# Patient Record
Sex: Male | Born: 1985 | Race: White | Hispanic: No | Marital: Married
Health system: Southern US, Community
[De-identification: ages and names within clinical notes are randomized; demographics above are authoritative.]

---

## 2019-07-29 ENCOUNTER — Emergency Department (HOSPITAL_COMMUNITY): Payer: Managed Care, Other (non HMO)

## 2019-07-29 ENCOUNTER — Encounter (HOSPITAL_COMMUNITY): Payer: Self-pay

## 2019-07-29 ENCOUNTER — Other Ambulatory Visit: Payer: Self-pay

## 2019-07-29 ENCOUNTER — Emergency Department (HOSPITAL_COMMUNITY)
Admission: EM | Admit: 2019-07-29 | Discharge: 2019-07-30 | Disposition: A | Discharge status: Home / Self Care | Payer: Managed Care, Other (non HMO) | Attending: Emergency Medicine | Admitting: Emergency Medicine

## 2019-07-29 DIAGNOSIS — N200 Calculus of kidney: Secondary | ICD-10-CM

## 2019-07-29 DIAGNOSIS — R1031 Right lower quadrant pain: Secondary | ICD-10-CM | POA: Diagnosis present

## 2019-07-29 DIAGNOSIS — N132 Hydronephrosis with renal and ureteral calculous obstruction: Secondary | ICD-10-CM | POA: Diagnosis not present

## 2019-07-29 LAB — URINALYSIS, ROUTINE W REFLEX MICROSCOPIC
Bilirubin Urine: NEGATIVE
Glucose, UA: NEGATIVE mg/dL
Ketones, ur: NEGATIVE mg/dL
Leukocytes,Ua: NEGATIVE
Nitrite: NEGATIVE
Protein, ur: 30 mg/dL — AB
RBC / HPF: >50 RBC/hpf — ABNORMAL HIGH (ref 0–5)
Specific Gravity, Urine: 1.024 (ref 1.005–1.030)
pH: 5 (ref 5.0–8.0)

## 2019-07-29 MED ORDER — OXYCODONE-ACETAMINOPHEN 5-325 MG PO TABS
1.0000 | ORAL_TABLET | ORAL | Status: DC | PRN
  Administered 2019-07-29: 1 via ORAL
  Filled 2019-07-29: qty 1

## 2019-07-29 NOTE — ED Triage Notes (Signed)
Pt states that all day today he has been having L sided flank pain, lower abd pain, frequent urination with small amounts and dark urine. Pt reports that pain going to his testicles.

## 2019-07-30 LAB — URINE CULTURE: Culture: NO GROWTH

## 2019-07-30 MED ORDER — TAMSULOSIN HCL 0.4 MG PO CAPS: 0.4000 mg | ORAL_CAPSULE | Freq: Two times a day (BID) | ORAL | 0 refills | Status: AC

## 2019-07-30 MED ORDER — OXYCODONE-ACETAMINOPHEN 5-325 MG PO TABS: 1.0000 | ORAL_TABLET | Freq: Four times a day (QID) | ORAL | 0 refills | Status: AC | PRN

## 2019-07-30 MED ORDER — ONDANSETRON 4 MG PO TBDP: 4.0000 mg | ORAL_TABLET | Freq: Three times a day (TID) | ORAL | 0 refills | Status: AC | PRN

## 2019-07-30 NOTE — Discharge Instructions (Signed)
Please follow-up with the urologist if you don't pass the stone in the next 3 days.  Use a urine strainer.  When you catch the stone, take it to your doctor for analysis.    Return to the ER for new or worsening symptoms.

## 2019-07-30 NOTE — ED Provider Notes (Signed)
[facility] EMERGENCY DEPARTMENT Provider Note   CSN: [id number] Arrival date & time: 07/29/19  2002     History   Chief Complaint Chief Complaint  Patient presents with  . Flank Pain    HPI Derek Mcmahon is a 33 y.o. male.     Patient presents to the emergency department with a chief complaint of left flank pain.  He states that he awoke with pain this morning.  It has been waxing and waning all day.  This evening it became severe.  He reports associated urinary hesitancy, but denies any dysuria, hematuria, or penile discharge.  He states that the pain radiates to his testicles.  He denies ever having had kidney stones, but reports significant family history of kidney stones.  Is any fevers or chills.  Denies any treatments prior to arrival.  He was given Percocet in triage with good relief.  The history is provided by the patient. No language interpreter was used.    History reviewed. No pertinent past medical history.  There are no active problems to display for this patient.   History reviewed. No pertinent surgical history.      Home Medications    Prior to Admission medications   Not on File    Family History No family history on file.  Social History Social History   Tobacco Use  . Smoking status: Not on file  Substance Use Topics  . Alcohol use: Not on file  . Drug use: Not on file     Allergies   Patient has no known allergies.   Review of Systems Review of Systems  All other systems reviewed and are negative.    Physical Exam Updated Vital Signs BP (!) 165/82 (BP Location: Left Arm)   Pulse 92   Temp 98.7 F (37.1 C) (Oral)   Resp 17   Ht 5\' 7"  (1.702 m)   Wt 99.8 kg   SpO2 98%   BMI 34.46 kg/m   Physical Exam Vitals signs and nursing note reviewed.  Constitutional:      Appearance: He is well-developed.  HENT:     Head: Normocephalic and atraumatic.  Eyes:     Conjunctiva/sclera: Conjunctivae normal.   Neck:     Musculoskeletal: Neck supple.  Cardiovascular:     Rate and Rhythm: Normal rate and regular rhythm.     Heart sounds: No murmur.  Pulmonary:     Effort: Pulmonary effort is normal. No respiratory distress.     Breath sounds: Normal breath sounds.  Abdominal:     Palpations: Abdomen is soft.     Tenderness: There is no abdominal tenderness.  Skin:    General: Skin is warm and dry.  Neurological:     Mental Status: He is alert and oriented to person, place, and time.  Psychiatric:        Mood and Affect: Mood normal.        Behavior: Behavior normal.      ED Treatments / Results  Labs (all labs ordered are listed, but only abnormal results are displayed) Labs Reviewed  URINALYSIS, ROUTINE W REFLEX MICROSCOPIC - Abnormal; Notable for the following components:      Result Value   APPearance HAZY (*)    Hgb urine dipstick LARGE (*)    Protein, ur 30 (*)    RBC / HPF >50 (*)    Bacteria, UA RARE (*)    All other components within normal limits    EKG  None  Radiology Ct Renal Stone Study  Result Date: 07/29/2019 CLINICAL DATA:  Initial evaluation for acute left flank pain. EXAM: CT ABDOMEN AND PELVIS WITHOUT CONTRAST TECHNIQUE: Multidetector CT imaging of the abdomen and pelvis was performed following the standard protocol without IV contrast. COMPARISON:  None. FINDINGS: Lower chest: Visualized lung bases are clear. Hepatobiliary: Liver demonstrates a normal unenhanced appearance. Gallbladder within normal limits. No biliary dilatation. Pancreas: Pancreas within normal limits. Spleen: Spleen within normal limits. Adrenals/Urinary Tract: Adrenal glands are normal. Right kidney unremarkable without nephrolithiasis or hydronephrosis. No obstructive radiopaque calculi seen along the course of the right renal collecting system. No right-sided hydroureter. On the left, there is a 3 mm obstructive stone within the distal left ureter with secondary mild left  hydroureteronephrosis. No other radiopaque calculi seen within the left kidney or along the course of the left renal collecting system. Partially distended bladder within normal limits. No layering stones within the bladder lumen. Stomach/Bowel: Stomach within normal limits. No evidence for bowel obstruction. Normal appendix. No acute inflammatory changes seen about the bowels. Vascular/Lymphatic: Intra-abdominal aorta of normal caliber. No adenopathy. Reproductive: Prostate and seminal vesicles within normal limits. Other: No free air or fluid. Musculoskeletal: No acute osseous abnormality. No discrete lytic or blastic osseous lesions. IMPRESSION: 1. 3 mm obstructive stone within the distal left ureter with secondary mild left hydroureteronephrosis. 2. No other acute intra-abdominal or pelvic process. Electronically Signed   By: Jeannine Boga M.D.   On: 07/29/2019 22:14    Procedures Procedures (including critical care time)  Medications Ordered in ED Medications  oxyCODONE-acetaminophen (PERCOCET/ROXICET) 5-325 MG per tablet 1 tablet (1 tablet Oral Given 07/29/19 2143)     Initial Impression / Assessment and Plan / ED Course  I have reviewed the triage vital signs and the nursing notes.  Pertinent labs & imaging results that were available during my care of the patient were reviewed by me and considered in my medical decision making (see chart for details).       Patient with left flank pain.  CT ordered in triage shows 3 mm obstructive stone in the left distal ureter with mild hydroureteronephrosis.  This is consistent with the patient's symptoms.  Patient's pain is well controlled with 1 Percocet.  We will discharged home with prescription for Percocet and Flomax.  No fever.  Will send urine for culture.  Recommend urology follow-up.  Final Clinical Impressions(s) / ED Diagnoses   Final diagnoses:  Kidney stone    ED Discharge Orders         Ordered    oxyCODONE-acetaminophen  (PERCOCET) 5-325 MG tablet  Every 6 hours PRN     07/30/19 0112    tamsulosin (FLOMAX) 0.4 MG CAPS capsule  2 times daily     07/30/19 0112    ondansetron (ZOFRAN ODT) 4 MG disintegrating tablet  Every 8 hours PRN     07/30/19 0112           Montine Circle, PA-C 55/73/22 0254    Delora Fuel, MD 27/06/23 0120

## 2020-09-26 IMAGING — CT CT RENAL STONE PROTOCOL
2 of 4 series · 16 of 46 positions shown, 18 images · non-contrast
Comparison: None.

CLINICAL DATA: Initial evaluation for acute left flank pain.

EXAM:
CT ABDOMEN AND PELVIS WITHOUT CONTRAST
TECHNIQUE: Multidetector CT imaging of the abdomen and pelvis was performed
following the standard protocol without IV contrast.

[Series 3: stone study 5.0 i30f 2 · axial · 0.86mm/px · z∈[+875,+1345]mm · 13 of 104 slices shown, 15 images]
[im 5/104  soft-tissue]
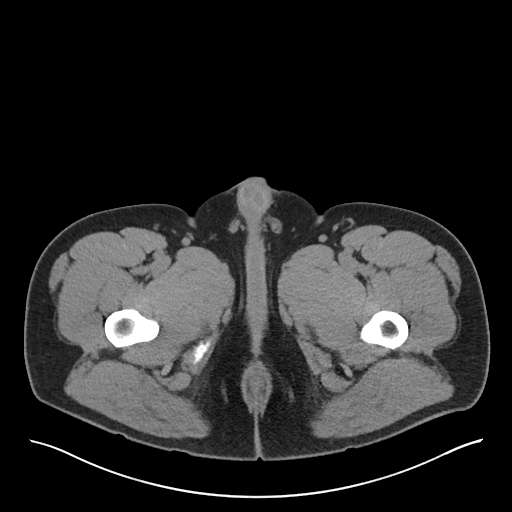
[im 5/104  bone]
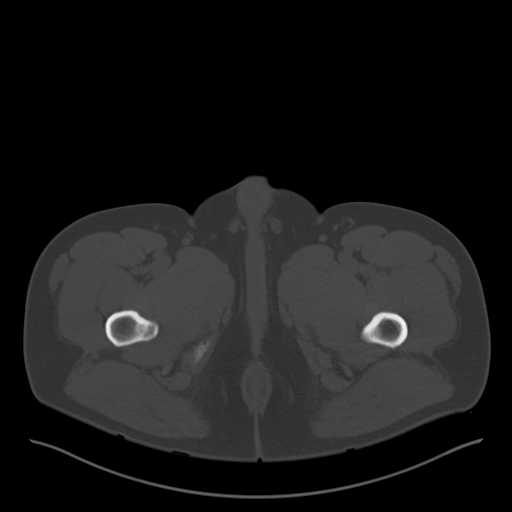
[im 13/104  soft-tissue]
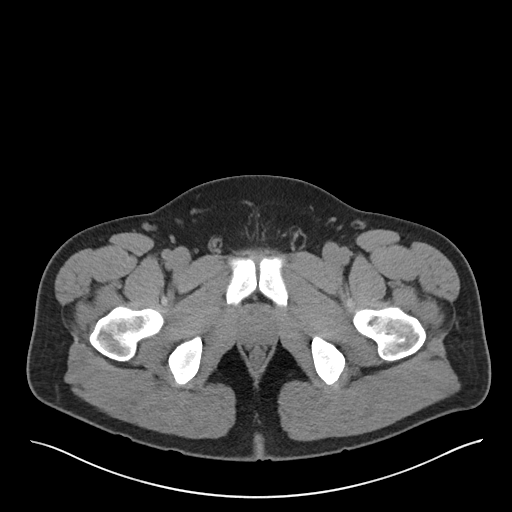
[im 21/104  soft-tissue]
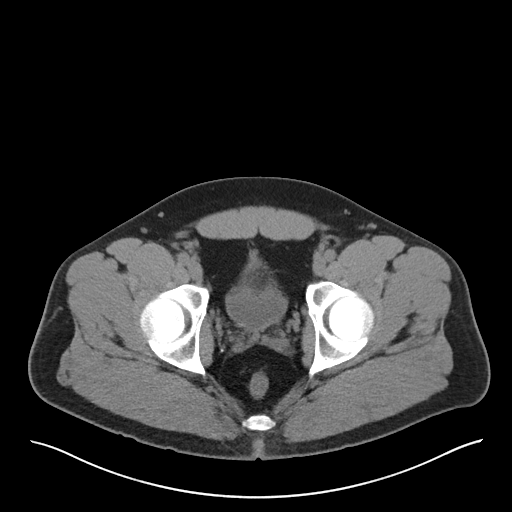
[im 29/104  soft-tissue]
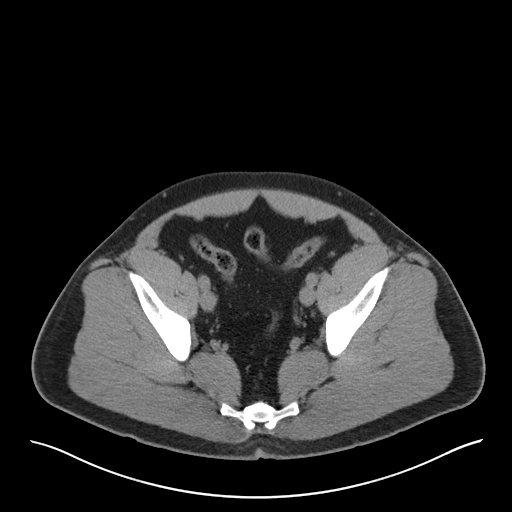
[im 38/104  soft-tissue]
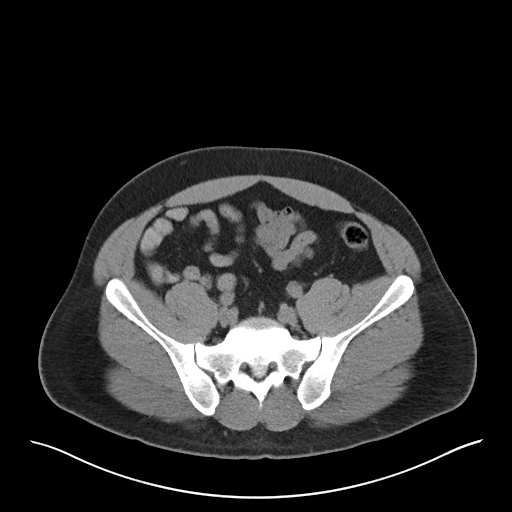
[im 46/104  soft-tissue]
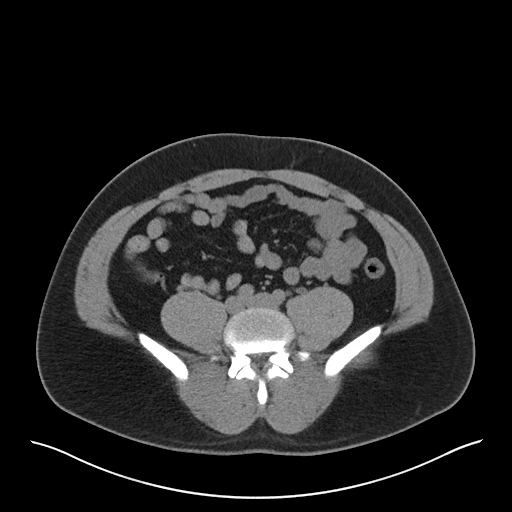
[im 54/104  soft-tissue]
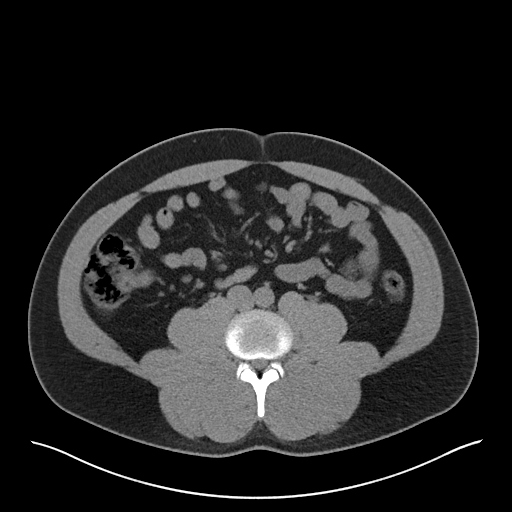
[im 58/104  soft-tissue]
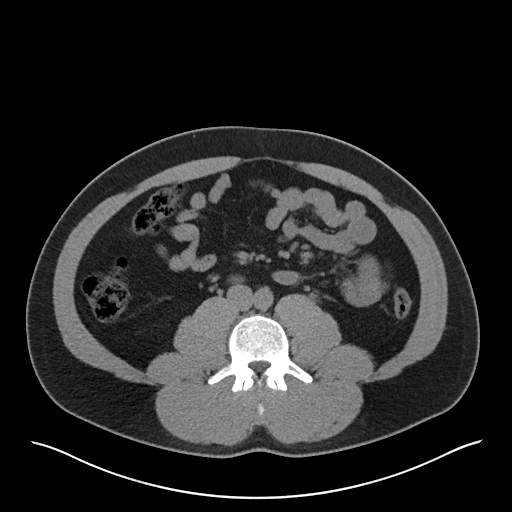
[im 66/104  soft-tissue]
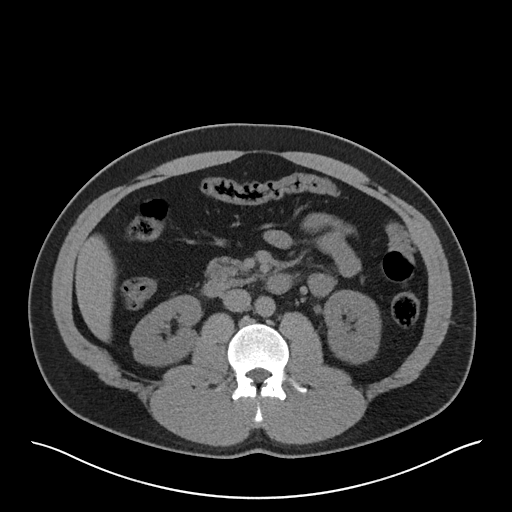
[im 66/104  bone]
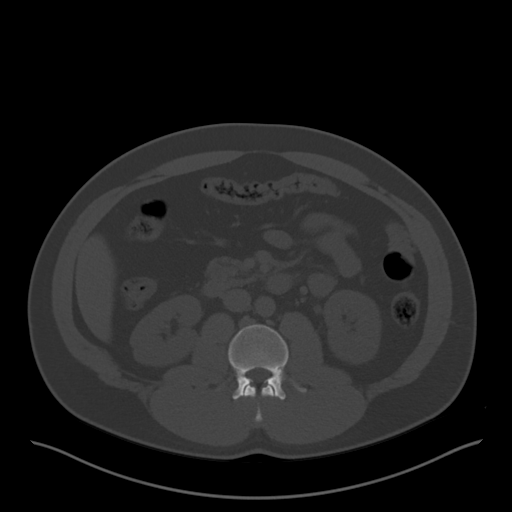
[im 75/104  soft-tissue]
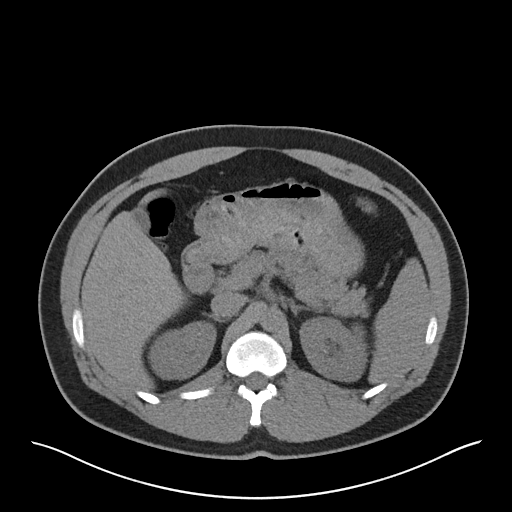
[im 83/104  soft-tissue]
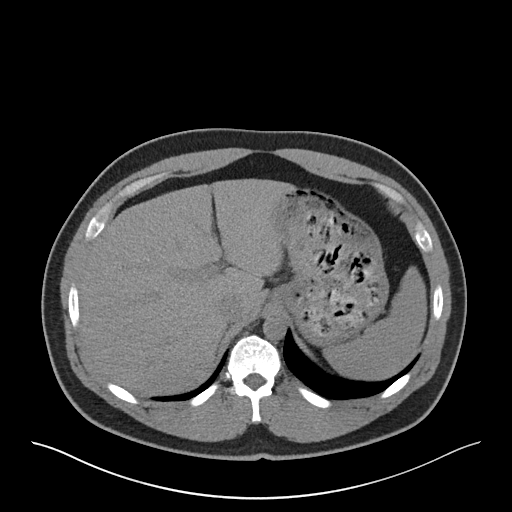
[im 91/104  soft-tissue]
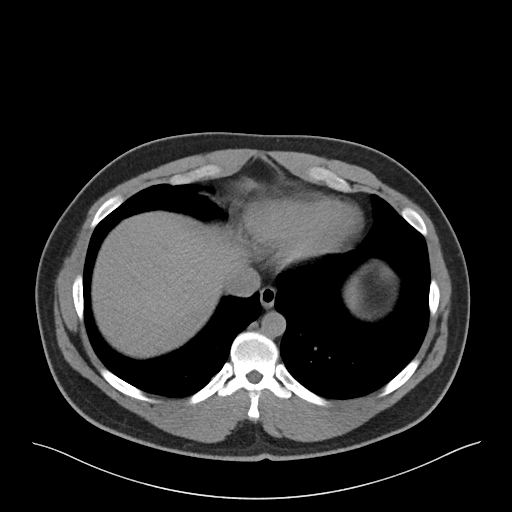
[im 99/104  soft-tissue]
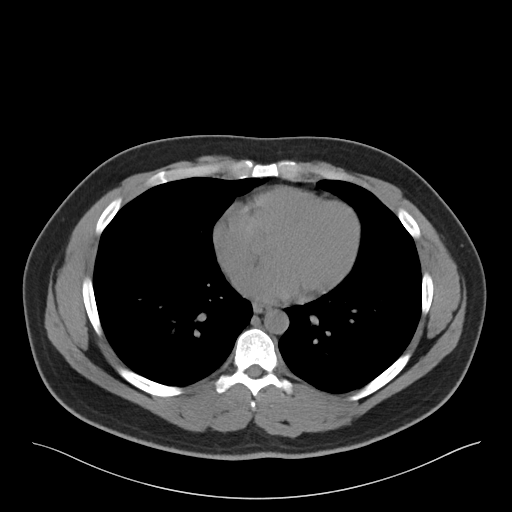

[Series 6: coronal soft tissue · coronal · 0.84mm/px · 3 of 110 slices shown]
[im 37/110  soft-tissue]
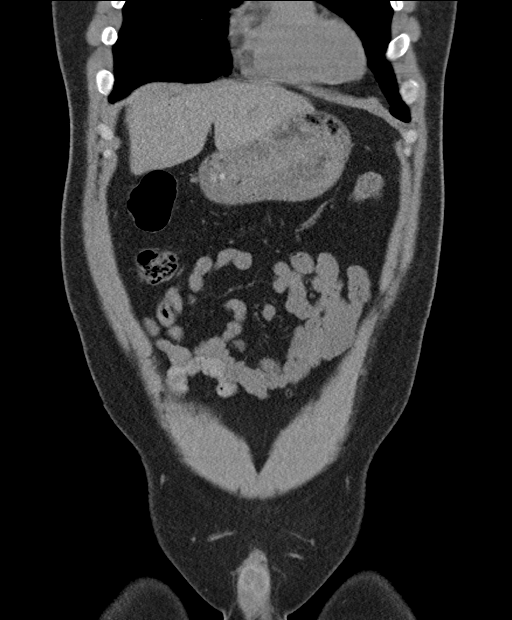
[im 49/110  soft-tissue]
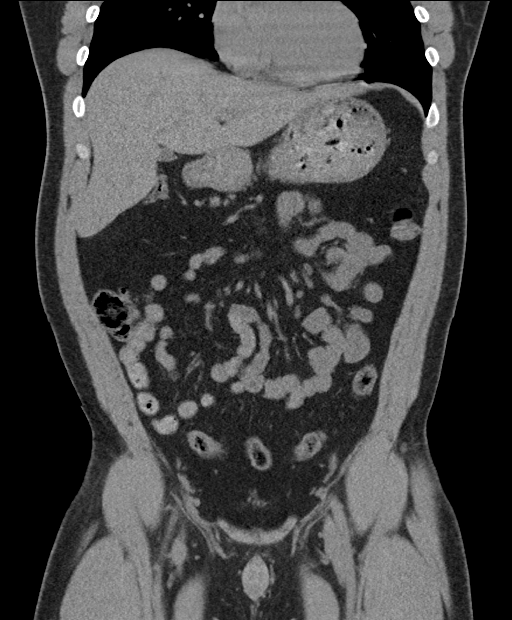
[im 61/110  soft-tissue]
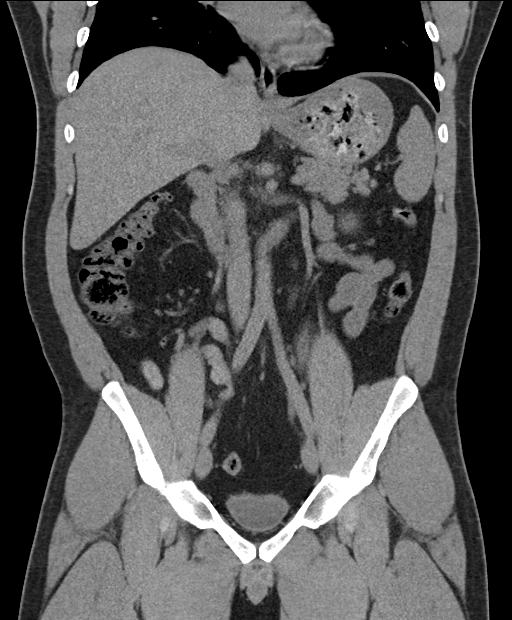

[16 of 46 positions shown; findings below may reference images not displayed]

FINDINGS: Lower chest: Visualized lung bases are clear.

Hepatobiliary: Liver demonstrates a normal unenhanced appearance.
Gallbladder within normal limits. No biliary dilatation.

Pancreas: Pancreas within normal limits.

Spleen: Spleen within normal limits.

Adrenals/Urinary Tract: Adrenal glands are normal.

Right kidney unremarkable without nephrolithiasis or hydronephrosis.
No obstructive radiopaque calculi seen along the course of the right
renal collecting system. No right-sided hydroureter.

On the left, there is a 3 mm obstructive stone within the distal
left ureter with secondary mild left hydroureteronephrosis. No other
radiopaque calculi seen within the left kidney or along the course
of the left renal collecting system.

Partially distended bladder within normal limits. No layering stones
within the bladder lumen.

Stomach/Bowel: Stomach within normal limits. No evidence for bowel
obstruction. Normal appendix. No acute inflammatory changes seen
about the bowels.

Vascular/Lymphatic: Intra-abdominal aorta of normal caliber. No
adenopathy.

Reproductive: Prostate and seminal vesicles within normal limits.

Other: No free air or fluid.

Musculoskeletal: No acute osseous abnormality. No discrete lytic or
blastic osseous lesions.
IMPRESSION: 1. 3 mm obstructive stone within the distal left ureter with
secondary mild left hydroureteronephrosis.
2. No other acute intra-abdominal or pelvic process.
